# Patient Record
Sex: Female | Born: 1995 | Race: White | Hispanic: No | Marital: Single | Smoking: Current every day smoker
Health system: Southern US, Community
[De-identification: ages and names within clinical notes are randomized; demographics above are authoritative.]

## PROBLEM LIST (undated history)

## (undated) HISTORY — PX: MOUTH SURGERY: SHX715

---
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
## 2017-06-22 ENCOUNTER — Emergency Department
Admission: EM | Admit: 2017-06-22 | Discharge: 2017-06-22 | Disposition: A | Discharge status: Home / Self Care | Payer: Self-pay | Attending: Emergency Medicine | Admitting: Emergency Medicine

## 2017-06-22 ENCOUNTER — Encounter: Payer: Self-pay | Admitting: Emergency Medicine

## 2017-06-22 DIAGNOSIS — Z79899 Other long term (current) drug therapy: Secondary | ICD-10-CM | POA: Insufficient documentation

## 2017-06-22 DIAGNOSIS — F17200 Nicotine dependence, unspecified, uncomplicated: Secondary | ICD-10-CM | POA: Insufficient documentation

## 2017-06-22 DIAGNOSIS — N390 Urinary tract infection, site not specified: Secondary | ICD-10-CM | POA: Insufficient documentation

## 2017-06-22 DIAGNOSIS — R300 Dysuria: Secondary | ICD-10-CM | POA: Insufficient documentation

## 2017-06-22 LAB — URINALYSIS, COMPLETE (UACMP) WITH MICROSCOPIC
BILIRUBIN URINE: NEGATIVE
Bacteria, UA: NONE SEEN
GLUCOSE, UA: NEGATIVE mg/dL
KETONES UR: NEGATIVE mg/dL
Nitrite: NEGATIVE
Protein, ur: 100 mg/dL — AB
SPECIFIC GRAVITY, URINE: 1.014 (ref 1.005–1.030)
pH: 6 (ref 5.0–8.0)

## 2017-06-22 LAB — POCT PREGNANCY, URINE: Preg Test, Ur: NEGATIVE

## 2017-06-22 MED ORDER — CEPHALEXIN 500 MG PO CAPS: 500.0000 mg | ORAL_CAPSULE | Freq: Four times a day (QID) | ORAL | 0 refills | Status: AC

## 2017-06-22 MED ORDER — KETOROLAC TROMETHAMINE 60 MG/2ML IM SOLN
60.0000 mg | Freq: Once | INTRAMUSCULAR | Status: AC
  Administered 2017-06-22: 60 mg via INTRAMUSCULAR
  Filled 2017-06-22: qty 2

## 2017-06-22 MED ORDER — CEPHALEXIN 500 MG PO CAPS
500.0000 mg | ORAL_CAPSULE | Freq: Once | ORAL | Status: AC
  Administered 2017-06-22: 500 mg via ORAL
  Filled 2017-06-22: qty 1

## 2017-06-22 MED ORDER — TRAMADOL HCL 50 MG PO TABS: 50.0000 mg | ORAL_TABLET | Freq: Four times a day (QID) | ORAL | 0 refills | Status: DC | PRN

## 2017-06-22 NOTE — Discharge Instructions (Signed)
Please take the antibiotics but return if you have any fevers, nausea, vomiting or worsening symptoms. Please follow-up with the acute care clinic.

## 2017-06-22 NOTE — ED Triage Notes (Signed)
Pt ambulatory to triage with no difficulty. Pt reports started about 4 days ago with pain, burning and frequency when she urinated. Pt reports pain across her lower abd and across her kidney region. Pt talking in full and complete sentences with no difficulty at this time.

## 2017-06-22 NOTE — ED Provider Notes (Signed)
[facility] Emergency Department Provider Note   ____________________________________________   First MD Initiated Contact with Patient 06/22/17 [phone]     (approximate)  I have reviewed the triage vital signs and the nursing notes.   HISTORY  Chief Complaint Urinary Frequency    HPI Christine DaftKirsten Haynes is a 21 y.o. female who comes into the hospital today with some lower abdominal pain and right flank pain. She reports that started about 5 days ago. She has pain with urination and states that she noticed some blood in her urine. She also has a full smell to her urine. The patient has a history of UTIs in the past and feels as though it is the same. The patient has been taking Azo at home but it has not been helping. The patient rates her pain a 4 out of 10 in intensity. She's had no fevers but she's had some hot flashes. The patient denies nausea or vomiting. She decided to come into the hospital today for evaluation.   History reviewed. No pertinent past medical history.  There are no active problems to display for this patient.   Past Surgical History:  Procedure Laterality Date  . MOUTH SURGERY      Prior to Admission medications   Medication Sig Start Date End Date Taking? Authorizing Provider  cephALEXin (KEFLEX) 500 MG capsule Take 1 capsule (500 mg total) by mouth 4 (four) times daily. 06/22/17 07/02/17  Rebecka ApleyWebster, Masie Bermingham P, MD  meloxicam (MOBIC) 15 MG tablet Take 1 tablet (15 mg total) by mouth daily. 12/16/16   Cuthriell, Delorise RoyalsJonathan D, PA-C  methocarbamol (ROBAXIN) 500 MG tablet Take 1 tablet (500 mg total) by mouth 4 (four) times daily. 12/16/16   Cuthriell, Delorise RoyalsJonathan D, PA-C  traMADol (ULTRAM) 50 MG tablet Take 1 tablet (50 mg total) by mouth every 6 (six) hours as needed. 06/22/17   Rebecka ApleyWebster, Terena Bohan P, MD    Allergies Penicillins  No family history on file.  Social History Social History  Substance Use Topics  . Smoking status: Current Every Day  Smoker  . Smokeless tobacco: Not on file  . Alcohol use No    Review of Systems  Constitutional: No fever/chills Eyes: No visual changes. ENT: No sore throat. Cardiovascular: Denies chest pain. Respiratory: Denies shortness of breath. Gastrointestinal:  abdominal pain.  No nausea, no vomiting.  No diarrhea.  No constipation. Genitourinary:  dysuria. Musculoskeletal: back pain. Skin: Negative for rash. Neurological: Negative for headaches, focal weakness or numbness.   ____________________________________________   PHYSICAL EXAM:  VITAL SIGNS: ED Triage Vitals  Enc Vitals Group     BP 06/22/17 0102 112/68     Pulse Rate 06/22/17 0102 98     Resp 06/22/17 0102 18     Temp 06/22/17 0102 98.5 F (36.9 C)     Temp Source 06/22/17 0102 Oral     SpO2 06/22/17 0102 98 %     Weight 06/22/17 0103 125 lb (56.7 kg)     Height 06/22/17 0103 5\' 4"  (1.626 m)     Head Circumference --      Peak Flow --      Pain Score 06/22/17 0102 7     Pain Loc --      Pain Edu? --      Excl. in GC? --     Constitutional: Alert and oriented. Well appearing and in mild distress. Eyes: Conjunctivae are normal. PERRL. EOMI. Head: Atraumatic. Nose: No congestion/rhinnorhea. Mouth/Throat: Mucous membranes are moist.  Oropharynx non-erythematous. Cardiovascular: Normal rate, regular rhythm. Grossly normal heart sounds.  Good peripheral circulation. Respiratory: Normal respiratory effort.  No retractions. Lungs CTAB. Gastrointestinal: Soft and nontender. No distention. Positive bowel sounds Musculoskeletal: No lower extremity tenderness nor edema.   Neurologic:  Normal speech and language.  Skin:  Skin is warm, dry and intact.  Psychiatric: Mood and affect are normal.   ____________________________________________   LABS (all labs ordered are listed, but only abnormal results are displayed)  Labs Reviewed  URINALYSIS, COMPLETE (UACMP) WITH MICROSCOPIC - Abnormal; Notable for the following:         Result Value   Color, Urine YELLOW (*)    APPearance CLOUDY (*)    Hgb urine dipstick MODERATE (*)    Protein, ur 100 (*)    Leukocytes, UA LARGE (*)    Squamous Epithelial / LPF 6-30 (*)    All other components within normal limits  URINE CULTURE  POC URINE PREG, ED  POCT PREGNANCY, URINE   ____________________________________________  EKG  none ____________________________________________  RADIOLOGY  No results found.  ____________________________________________   PROCEDURES  Procedure(s) performed: None  Procedures  Critical Care performed: No  ____________________________________________   INITIAL IMPRESSION / ASSESSMENT AND PLAN / ED COURSE  Pertinent labs & imaging results that were available during my care of the patient were reviewed by me and considered in my medical decision making (see chart for details).  This is a 21 year old female who comes into the hospital today with some urinary frequency, lower abdominal pain, pain with urination and foul-smelling urine. The patient does have some dirty-appearing urine with too numerous to count red blood cells and white blood cells. The patient though was sleeping during the exam. She was in no acute distress. I will give the patient a dose of Keflex and a shot of Toradol. I will discharge the patient to home with some antibiotics to treat her UTI. I will also send a urine culture. The patient has no other concerns or no other distress and she'll be discharged home to follow-up with the acute care clinic for further evaluation. She should return with any other questions or concerns.      ____________________________________________   FINAL CLINICAL IMPRESSION(S) / ED DIAGNOSES  Final diagnoses:  Lower urinary tract infectious disease  Dysuria      NEW MEDICATIONS STARTED DURING THIS VISIT:  Discharge Medication List as of 06/22/2017  5:21 AM    START taking these medications   Details   cephALEXin (KEFLEX) 500 MG capsule Take 1 capsule (500 mg total) by mouth 4 (four) times daily., Starting Mon 06/22/2017, Until Thu 07/02/2017, Print    traMADol (ULTRAM) 50 MG tablet Take 1 tablet (50 mg total) by mouth every 6 (six) hours as needed., Starting Mon 06/22/2017, Print         Note:  This document was prepared using Dragon voice recognition software and may include unintentional dictation errors.    Rebecka Apley, MD 06/22/17 [phone]

## 2017-06-24 LAB — URINE CULTURE
Culture: >=100000 — AB
Special Requests: NORMAL

## 2017-06-25 NOTE — Progress Notes (Signed)
ED Culture Results   Allergies: Penicillins  Visit Date:06/22/17 Chief Complaint:dysuria/  Culture Type: urine Culture Results: 100k CFU Staph saprophyticus  Original Abx given: Keflex 500 mg  Original Abx sensitive, intermediate, or resistant: resistant to oxacillin  Recommended Abx: Nitrofurantoin  ED Physician: Deirdre PriestPadchowski, Kevin  Contacted Patient: Yes left message with patient's father for patient to call back @ earliest convenience  Prescription Called into: Awaiting call back .  Demetrius Charityeldrin D. Shaela Boer, PharmD, BCPS Clinical Pharmacist

## 2017-06-26 NOTE — Progress Notes (Signed)
ED Culture Results   Allergies: Penicillins   Visit Date:06/22/17  Chief Complaint:dysuria            Dx: UTI  Culture Type: urine Culture Results: 100k CFU Staph saprophyticus   Original Abx given: Keflex 500 mg  Original Abx sensitive, intermediate, or resistant: resistant to oxacillin   Recommended Abx: Nitrofurantoin (Macrobid) 100mg  BID x 5 days. Discontinue current Abx.  ED Physician: Deirdre PriestPadchowski, Kevin   Contacted Patient: Day 2 of attempting to reach patient. Called phone number listed in chart [phone]) for patient and left voice mail at 1400 on 7/13 for patient to call back. Attempted to call number 3 times, but number goes directly to voice mail.   Message was also left with father on 7/12 to have patient call pharmacy.   Prescription Called into: Awaiting call back .  Gardner CandleSheema M Felesha Moncrieffe, PharmD, BCPS Clinical Pharmacist 06/26/2017 3:25 PM

## 2020-03-22 ENCOUNTER — Telehealth: Payer: Self-pay | Admitting: Obstetrics & Gynecology

## 2020-03-22 NOTE — Telephone Encounter (Signed)
[place] [facility] referring for pain with intercourse. Attempt to reach patient to schedule referral. Phone number on file rang a couple of times and disconnected on the other end.

## 2020-03-23 NOTE — Telephone Encounter (Signed)
Called and left voice mail for patient to call back to be schedule °

## 2020-03-26 NOTE — Telephone Encounter (Signed)
Called and left voice mail for patient to call back to be schedule °

## 2020-04-16 ENCOUNTER — Other Ambulatory Visit: Payer: Self-pay

## 2020-04-16 ENCOUNTER — Encounter: Payer: Self-pay | Admitting: Obstetrics and Gynecology

## 2020-04-16 ENCOUNTER — Other Ambulatory Visit (HOSPITAL_COMMUNITY)
Admission: RE | Admit: 2020-04-16 | Discharge: 2020-04-16 | Disposition: A | Discharge status: Home / Self Care | Payer: 59 | Source: Ambulatory Visit | Attending: Obstetrics and Gynecology | Admitting: Obstetrics and Gynecology

## 2020-04-16 ENCOUNTER — Ambulatory Visit (INDEPENDENT_AMBULATORY_CARE_PROVIDER_SITE_OTHER): Payer: 59 | Admitting: Obstetrics and Gynecology

## 2020-04-16 VITALS — BP 122/78 | Ht 65.0 in | Wt 139.0 lb

## 2020-04-16 DIAGNOSIS — N941 Unspecified dyspareunia: Secondary | ICD-10-CM | POA: Diagnosis not present

## 2020-04-16 DIAGNOSIS — Z124 Encounter for screening for malignant neoplasm of cervix: Secondary | ICD-10-CM | POA: Diagnosis not present

## 2020-04-16 DIAGNOSIS — R102 Pelvic and perineal pain: Secondary | ICD-10-CM

## 2020-04-16 DIAGNOSIS — N730 Acute parametritis and pelvic cellulitis: Secondary | ICD-10-CM | POA: Diagnosis not present

## 2020-04-16 DIAGNOSIS — Z113 Encounter for screening for infections with a predominantly sexual mode of transmission: Secondary | ICD-10-CM | POA: Insufficient documentation

## 2020-04-16 MED ORDER — METRONIDAZOLE 500 MG PO TABS: 500.0000 mg | ORAL_TABLET | Freq: Two times a day (BID) | ORAL | 0 refills | Status: AC

## 2020-04-16 MED ORDER — DOXYCYCLINE HYCLATE 100 MG PO CAPS: 100.0000 mg | ORAL_CAPSULE | Freq: Two times a day (BID) | ORAL | 0 refills | Status: AC

## 2020-04-16 MED ORDER — CEFTRIAXONE SODIUM 250 MG IJ SOLR
250.0000 mg | Freq: Once | INTRAMUSCULAR | Status: AC
  Administered 2020-04-16: 250 mg via INTRAMUSCULAR

## 2020-04-16 NOTE — Patient Instructions (Addendum)
Semen Analysis Test Why am I having this test? A semen analysis test is done to check certain aspects of the health of a man's reproductive organs (testes) and the hormone system that plays a role in semen production. Semen is a whitish secretion that is released from the penis during the final phase of orgasm (ejaculation). It is made up of liquids and nutrients from the prostate gland, seminal vesicles, and other glands. It also contains sperm cells from the testes. A single sperm cell contains one complete set of a man's genetic coding (chromosomes). You may have this test as a part of infertility testing, which is done to help find out reasons for the inability to have a child. The test may also be done to determine whether a previously performed vasectomy was successful. A vasectomy is a procedure done to make a man permanently infertile by tying the tube (the vas deferens) that collects the sperm from the testicle. A vasectomy blocks the sperm from going through the vas deferens and penis so that the sperm will not go into the vagina during sexual intercourse. What is being tested? If the test is done as part of infertility testing, the shape (morphology), size, and movement (motility) of sperm cells will be included in the analysis. This testing may also include assessing a sperm cell's ability to penetrate an egg (fertilize) as well as the formation of genetic material (DNA). If the test is done to check the success of a vasectomy, the sample will be checked for the presence of sperm. What kind of sample is taken? A semen sample is required for this test. A semen sample will be collected by ejaculation into a sterile glass or plastic container provided by the lab. This can be done at home, in your health care provider's office, or in the lab. How do I collect samples at home?  If the sample will be collected at home, follow your health care provider's instructions about how to collect the  sample. The sample should be delivered to the lab within 1 hour after collection. It should also be protected from extreme heat or cold. How do I prepare for this test? For infertility testing: Avoid sexual activity for 2-3 days before the semen sample collection. However, do not avoid ejaculation for a prolonged period because this can alter the motility of sperm cells. For vasectomy success testing: Make sure that you ejaculate one or two times before the day of semen sample collection. This will clear the vas deferens of any sperm that were present before the vasectomy was performed. Tell a health care provider about:  All medicines you are taking, including vitamins, herbs, eye drops, creams, and over-the-counter medicines. How are the results reported? Your test results will be reported as values. Your health care provider will compare your results to normal ranges that were established after testing a large group of people (reference values). Reference values may vary among labs and hospitals. For this test, common reference values are:  Volume: 2-5 mL.  Liquefaction time: 20-30 minutes after collection.  Appearance: normal (whitish in color).  Motile/mL: greater than or equal to 10 million.  Sperm/mL: greater than or equal to 20 million.  Viscosity: greater than or equal to 3.  Agglutination: greater than or equal to 3.  Supravital: greater than or equal to 75% live.  Fructose: positive.  pH: 7.12-8.  Sperm count (density): greater than or equal to 20 million/mL.  Sperm motility: greater than or equal  to 50% at 1 hour.  Sperm morphology: greater than 30% Reuben Likes criteria greater than 14%) normally shaped. What do the results mean? Low sperm count, abnormal motility, or abnormal morphology of sperm cells may all cause problems with female fertility. Abnormal results can also be caused by:  Certain infectious diseases, such as inflammation of a testis (orchitis) from  mumps.  Receiving certain kinds of toxic drugs, such as chemotherapy.  Having testicles that did not develop normally in childhood. When the semen analysis test is done to check the success of a vasectomy, the presence of sperm may mean that the surgery was not successful. Your health care provider may suggest a repeat of this test. Talk with your health care provider about what your results mean. Questions to ask your health care provider Ask your health care provider, or the department that is doing the test:  When will my results be ready?  How will I get my results?  What are my treatment options?  What other tests do I need?  What are my next steps? Summary  A semen analysis test is done to check certain aspects of the health of a man's reproductive organs (testes) and the hormone system that plays a role in semen production.  You may have this test as a part of infertility testing, which is done to help find out reasons for the inability to have a child. The test may also be done to determine whether a previously performed vasectomy was successful.  A semen sample will be collected by ejaculation into a sterile glass or plastic container. Follow instructions about how to collect the sample, and make sure you deliver the sample to the lab within 1 hour of obtaining it.  Talk with your health care provider about what your results mean. This information is not intended to replace advice given to you by your health care provider. Make sure you discuss any questions you have with your health care provider. Document Revised: 08/27/2017 Document Reviewed: 08/27/2017 Elsevier Patient Education  2020 [employer].  Female Infertility  Female infertility refers to a woman's inability to get pregnant (conceive) after a year of having sex regularly (or after 6 months in women over age 74) without using birth control. Infertility can also mean that a woman is not able to carry a pregnancy  to full term. Both women and men can have fertility problems. What are the causes? This condition may be caused by:  Problems with reproductive organs. Infertility can result if a woman: ? Has an abnormally short cervix or a cervix that does not remain closed during a pregnancy. ? Has a blockage or scarring in the fallopian tubes. ? Has an abnormally shaped uterus. ? Has uterine fibroids. This is a benign mass of tissue or muscle (tumor) that can develop in the uterus. ? Is not ovulating in a regular way.  Certain medical conditions. These may include: ? Polycystic ovary syndrome (PCOS). This is a hormonal disorder that can cause small cysts to grow on the ovaries. This is the most common cause of infertility in women. ? Endometriosis. This is a condition in which the tissue that lines the uterus (endometrium) grows outside of its normal location. ? Cancer and cancer treatments, such as chemotherapy or radiation. ? Premature ovarian failure. This is when ovaries stop producing eggs and hormones before age 51. ? Sexually transmitted diseases, such as chlamydia or gonorrhea. ? Autoimmune disorders. These are disorders in which the body's defense system (immune  system) attacks normal, healthy cells. Infertility can be linked to more than one cause. For some women, the cause of infertility is not known (unexplained infertility). What increases the risk?  Age. A woman's fertility declines with age, especially after her mid-30s.  Being underweight or overweight.  Drinking too much alcohol.  Using drugs such as anabolic steroids, cocaine, and marijuana.  Exercising excessively.  Being exposed to environmental toxins, such as radiation, pesticides, and certain chemicals. What are the signs or symptoms? The main sign of infertility in women is the inability to get pregnant or carry a pregnancy to full term. How is this diagnosed? This condition may be diagnosed by:  Checking whether you  are ovulating each month. The tests may include: ? Blood tests to check hormone levels. ? An ultrasound of the ovaries. ? Taking a small tissue that lines the uterus and checking it under a microscope (endometrial biopsy).  Doing additional tests. This is done if ovulation is normal. Tests may include: ? Hysterosalpingography. This X-ray test can show the shape of the uterus and whether the fallopian tubes are open. ? Laparoscopy. This test uses a lighted tube (laparoscope) to look for problems in the fallopian tubes and other organs. ? Transvaginal ultrasound. This imaging test is used to check for abnormalities in the uterus and ovaries. ? Hysteroscopy. This test uses a lighted tube to check for problems in the cervix and the uterus. To be diagnosed with infertility, both partners will have a physical exam. Both partners will also have an extensive medical and sexual history taken. Additional tests may be done. How is this treated? Treatment depends on the cause of infertility. Most cases of infertility in women are treated with medicine or surgery.  Women may take medicine to: ? Correct ovulation problems. ? Treat other health conditions.  Surgery may be done to: ? Repair damage to the ovaries, fallopian tubes, cervix, or uterus. ? Remove growths from the uterus. ? Remove scar tissue from the uterus, pelvis, or other organs. Assisted reproductive technology (ART) Assisted reproductive technology (ART) refers to all treatments and procedures that combine eggs and sperm outside the body to try to help a couple conceive. ART is often combined with fertility drugs to stimulate ovulation. Sometimes ART is done using eggs retrieved from another woman's body (donor eggs) or from previously frozen fertilized eggs (embryos). There are different types of ART. These include:  Intrauterine insemination (IUI). A long, thin tube is used to place sperm directly into a woman's uterus. This  procedure: ? Is effective for infertility caused by sperm problems, including low sperm count and low motility. ? Can be used in combination with fertility drugs.  In vitro fertilization (IVF). This is done when a woman's fallopian tubes are blocked or when a man has low sperm count. In this procedure: ? Fertility drugs are used to stimulate the ovaries to produce multiple eggs. ? Once mature, these eggs are removed from the body and combined with the sperm to be fertilized. ? The fertilized eggs are then placed into the woman's uterus. Follow these instructions at home:  Take over-the-counter and prescription medicines only as told by your health care provider.  Do not use any products that contain nicotine or tobacco, such as cigarettes and e-cigarettes. If you need help quitting, ask your health care provider.  If you drink alcohol, limit how much you have to 1 drink a day.  Make dietary changes to lose weight or maintain a healthy weight. Work  with your health care provider and a dietitian to set a weight-loss goal that is healthy and reasonable for you.  Seek support from a counselor or support group to talk about your concerns related to infertility. Couples counseling may be helpful for you and your partner.  Practice stress reduction techniques that work well for you, such as regular physical activity, meditation, or deep breathing.  Keep all follow-up visits as told by your health care provider. This is important. Contact a health care provider if you:  Feel that stress is interfering with your life and relationships.  Have side effects from treatments for infertility. Summary  Female infertility refers to a woman's inability to get pregnant (conceive) after a year of having sex regularly (or after 6 months in women over age 42) without using birth control.  To be diagnosed with infertility, both partners will have a physical exam. Both partners will also have an extensive  medical and sexual history taken.  Seek support from a counselor or support group to talk about your concerns related to infertility. Couples counseling may be helpful for you and your partner. This information is not intended to replace advice given to you by your health care provider. Make sure you discuss any questions you have with your health care provider. Document Revised: 03/24/2019 Document Reviewed: 11/02/2017 Elsevier Patient Education  [place]. Dyspareunia, Female Dyspareunia is pain that is associated with sexual activity. This can affect any part of the genitals or lower abdomen. There are many possible causes of this condition. In some cases, diagnosing the cause of dyspareunia can be difficult. This condition can be mild, moderate, or severe. Depending on the cause, dyspareunia may get better with treatment, but may return (recur) over time. What are the causes?  The cause of this condition is not always known. However, problems that affect the vulva, vagina, uterus, and other organs may cause dyspareunia. Common causes of this condition include:  Vaginal dryness.  Giving birth.  Infection.  Skin changes or conditions.  Side effects of medicines.  Endometriosis. This is when tissue that is like the lining of the uterus grows on the outside of the uterus.  Psychological conditions. These include depression, anxiety, or traumatic experiences.  Allergic reaction. What increases the risk? The following factors may make you more likely to develop this condition:  History of physical or sexual trauma.  Some medicines.  No longer having a monthly period (menopause).  Having recently given birth.  Taking baths using soaps that have perfumes. These can cause irritation.  Douching. What are the signs or symptoms? The main symptom of this condition is pain in any part of your genitals or lower abdomen during or after sex. This may include:  Irritation,  burning, or stinging sensations in your vulva.  Discomfort when your vulva or surrounding area is touched.  Aching and throbbing pain that may be constant.  Pain that gets worse when something is inserted into your vagina. How is this diagnosed? This condition may be diagnosed based on:  Your symptoms, including where and when your pain occurs.  Your medical history.  A physical exam. A pelvic exam will most likely be done.  Tests that include ultrasound, blood tests, and tests that check the body for infection.  Imaging tests, such as X-ray, MRI, and CT scan. You may be referred to a health care provider who specializes in women's health (gynecologist). How is this treated? Treatment depends on the cause of your condition and  your symptoms. In most cases, you may need to stop sexual activity until your symptoms go away or get better. Treatment may include:  Lubricants, ointments, and creams.  Physical therapy.  Massage therapy.  Hormonal therapy.  Medicines to: ? Prevent or fight infection. ? Relieve pain. ? Help numb the area. ? Treat depression (antidepressants).  Counseling, which may include sex therapy.  Surgery. Follow these instructions at home: Lifestyle  Wear cotton underwear.  Use water-based lubricants as needed during sex. Avoid oil-based lubricants.  Do not use any products that can cause irritation. This may include certain condoms, spermicides, lubricants, soaps, tampons, vaginal sprays, or douches.  Always practice safe sex. Use a condom to prevent sexually transmitted infections (STIs).  Talk freely with your partner about your condition. General instructions  Take or apply over-the-counter and prescription medicines only as told by your health care provider.  Urinate before you have sex.  Consider joining a support group.  Get the results of any tests you have done. Ask your health care provider, or the department that is doing the  procedure, when your results will be ready.  Keep all follow-up visits as told by your health care provider. This is important. Contact a health care provider if:  You have vaginal bleeding after having sex.  You develop a lump at the opening of your vagina even if the lump is painless.  You have: ? Abnormal discharge from your vagina. ? Vaginal dryness. ? Itchiness or irritation of your vulva or vagina. ? A new rash. ? Symptoms that get worse or do not improve with treatment. ? A fever. ? Pain when you urinate. ? Blood in your urine. Get help right away if:  You have severe pain in your abdomen during or shortly after sex.  You pass out after sex. Summary  Dyspareunia is pain that is associated with sexual activity. This can affect any part of the genitals or lower abdomen.  There are many causes of this condition. Treatment depends on the cause and your symptoms. In most cases, you may need to stop sexual activity until your symptoms improve.  Take or apply over-the-counter and prescription medicines only as told by your health care provider.  Contact a health care provider if your symptoms get worse or do not improve with treatment.  Keep all follow-up visits as told by your health care provider. This is important. This information is not intended to replace advice given to you by your health care provider. Make sure you discuss any questions you have with your health care provider. Document Revised: 02/07/2019 Document Reviewed: 02/07/2019 Elsevier Patient Education  2020 [employer].
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
## 2020-04-18 LAB — NUSWAB BV AND CANDIDA, NAA
Atopobium vaginae: HIGH Score — AB
Candida albicans, NAA: NEGATIVE
Candida glabrata, NAA: NEGATIVE

## 2020-04-19 LAB — CYTOLOGY - PAP
Chlamydia: NEGATIVE
Comment: NEGATIVE
Comment: NORMAL
Diagnosis: NEGATIVE
Neisseria Gonorrhea: NEGATIVE

## 2020-04-26 NOTE — Progress Notes (Signed)
Please let patient know her pap smear was normal and she did not have evidence of infection. Good news!

## 2020-04-27 ENCOUNTER — Telehealth: Payer: Self-pay

## 2020-04-27 NOTE — Telephone Encounter (Signed)
-----   Message from Natale Milch, MD sent at 04/26/2020  5:07 PM EDT ----- Please let patient know her pap smear was normal and she did not have evidence of infection. Good news!

## 2020-04-27 NOTE — Telephone Encounter (Signed)
LMVM to notify pt. Ok to leave detailed message per [facility]

## 2020-04-30 ENCOUNTER — Other Ambulatory Visit: Payer: Self-pay | Admitting: Obstetrics and Gynecology

## 2020-04-30 ENCOUNTER — Telehealth: Payer: Self-pay

## 2020-04-30 DIAGNOSIS — B3731 Acute candidiasis of vulva and vagina: Secondary | ICD-10-CM

## 2020-04-30 MED ORDER — FLUCONAZOLE 150 MG PO TABS: 150.0000 mg | ORAL_TABLET | ORAL | 0 refills | Status: AC

## 2020-04-30 NOTE — Telephone Encounter (Signed)
Spoke w/patient. She reports her stomach cramps are worse. Advised doxycycline can cause stomach upset. Prior to meds she had pain with intercourse (hurt inside vagina). Now she is having pain at the vaginal opening and is unable to have intercourse. Patient inquired about her apt time for next Tuesday 5/24. Relayed U/S @ 2:30 and f/u with [facility] immediately following (3:10).

## 2020-04-30 NOTE — Telephone Encounter (Signed)
Patient took medication for 4-5 days then stopped last Thursday. She reports that she is having more discharge and now pain with initiation of sex. Denies appearance of external lesions. Saw a small pink blood after wiping. Will treat with diflucan and patient will follow up next week.

## 2020-04-30 NOTE — Telephone Encounter (Signed)
LMVM TRC. Advised to ask for Lupe Handley. Need details of worsening of symptoms.

## 2020-04-30 NOTE — Telephone Encounter (Signed)
Patient had an apt last week & was rx'd 2 meds that are making her symptoms worse. She's inquiring if she can stop these meds and be rx'd something else. MC#[phone]

## 2020-05-07 ENCOUNTER — Other Ambulatory Visit: Payer: Self-pay

## 2020-05-07 ENCOUNTER — Ambulatory Visit (INDEPENDENT_AMBULATORY_CARE_PROVIDER_SITE_OTHER): Payer: 59

## 2020-05-07 ENCOUNTER — Encounter: Payer: Self-pay | Admitting: Obstetrics and Gynecology

## 2020-05-07 ENCOUNTER — Ambulatory Visit (INDEPENDENT_AMBULATORY_CARE_PROVIDER_SITE_OTHER): Payer: 59 | Admitting: Obstetrics and Gynecology

## 2020-05-07 VITALS — BP 120/68 | Ht 65.0 in | Wt 138.0 lb

## 2020-05-07 DIAGNOSIS — R102 Pelvic and perineal pain: Secondary | ICD-10-CM | POA: Diagnosis not present

## 2020-05-07 DIAGNOSIS — N941 Unspecified dyspareunia: Secondary | ICD-10-CM

## 2020-05-07 DIAGNOSIS — N730 Acute parametritis and pelvic cellulitis: Secondary | ICD-10-CM

## 2020-05-07 DIAGNOSIS — N979 Female infertility, unspecified: Secondary | ICD-10-CM

## 2020-05-07 MED ORDER — LIDOCAINE 5 % EX OINT: 1.0000 "application " | TOPICAL_OINTMENT | Freq: Every day | CUTANEOUS | 1 refills | Status: DC | PRN

## 2020-05-07 NOTE — Patient Instructions (Signed)
Steps to Quit Smoking Smoking tobacco is the leading cause of preventable death. It can affect almost every organ in the body. Smoking puts you and those around you at risk for developing many serious chronic diseases. Quitting smoking can be difficult, but it is one of the best things that you can do for your health. It is never too late to quit. How do I get ready to quit? When you decide to quit smoking, create a plan to help you succeed. Before you quit:  Pick a date to quit. Set a date within the next 2 weeks to give you time to prepare.  Write down the reasons why you are quitting. Keep this list in places where you will see it often.  Tell your family, friends, and co-workers that you are quitting. Support from your loved ones can make quitting easier.  Talk with your health care provider about your options for quitting smoking.  Find out what treatment options are covered by your health insurance.  Identify people, places, things, and activities that make you want to smoke (triggers). Avoid them. What first steps can I take to quit smoking?  Throw away all cigarettes at home, at work, and in your car.  Throw away smoking accessories, such as ashtrays and lighters.  Clean your car. Make sure to empty the ashtray.  Clean your home, including curtains and carpets. What strategies can I use to quit smoking? Talk with your health care provider about combining strategies, such as taking medicines while you are also receiving in-person counseling. Using these two strategies together makes you more likely to succeed in quitting than if you used either strategy on its own.  If you are pregnant or breastfeeding, talk with your health care provider about finding counseling or other support strategies to quit smoking. Do not take medicine to help you quit smoking unless your health care provider tells you to do so. To quit smoking: Quit right away  Quit smoking completely, instead of  gradually reducing how much you smoke over a period of time. Research shows that stopping smoking right away is more successful than gradually quitting.  Attend in-person counseling to help you build problem-solving skills. You are more likely to succeed in quitting if you attend counseling sessions regularly. Even short sessions of 10 minutes can be effective. Take medicine You may take medicines to help you quit smoking. Some medicines require a prescription and some you can purchase over-the-counter. Medicines may have nicotine in them to replace the nicotine in cigarettes. Medicines may:  Help to stop cravings.  Help to relieve withdrawal symptoms. Your health care provider may recommend:  Nicotine patches, gum, or lozenges.  Nicotine inhalers or sprays.  Non-nicotine medicine that is taken by mouth. Find resources Find resources and support systems that can help you to quit smoking and remain smoke-free after you quit. These resources are most helpful when you use them often. They include:  Online chats with a counselor.  Telephone quitlines.  Printed self-help materials.  Support groups or group counseling.  Text messaging programs.  Mobile phone apps or applications. Use apps that can help you stick to your quit plan by providing reminders, tips, and encouragement. There are many free apps for mobile devices as well as websites. Examples include Quit Guide from the CDC and smokefree.gov What things can I do to make it easier to quit?   Reach out to your family and friends for support and encouragement. Call telephone quitlines (1-800-QUIT-NOW), reach   out to support groups, or work with a Veterinary surgeoncounselor for support.  Ask people who smoke to avoid smoking around you.  Avoid places that trigger you to smoke, such as bars, parties, or smoke-break areas at work.  Spend time with people who do not smoke.  Lessen the stress in your life. Stress can be a smoking trigger for some  people. To lessen stress, try: ? Exercising regularly. ? Doing deep-breathing exercises. ? Doing yoga. ? Meditating. ? Performing a body scan. This involves closing your eyes, scanning your body from head to toe, and noticing which parts of your body are particularly tense. Try to relax the muscles in those areas. How will I feel when I quit smoking? Day 1 to 3 weeks Within the first 24 hours of quitting smoking, you may start to feel withdrawal symptoms. These symptoms are usually most noticeable 2-3 days after quitting, but they usually do not last for more than 2-3 weeks. You may experience these symptoms:  Mood swings.  Restlessness, anxiety, or irritability.  Trouble concentrating.  Dizziness.  Strong cravings for sugary foods and nicotine.  Mild weight gain.  Constipation.  Nausea.  Coughing or a sore throat.  Changes in how the medicines that you take for unrelated issues work in your body.  Depression.  Trouble sleeping (insomnia). Week 3 and afterward After the first 2-3 weeks of quitting, you may start to notice more positive results, such as:  Improved sense of smell and taste.  Decreased coughing and sore throat.  Slower heart rate.  Lower blood pressure.  Clearer skin.  The ability to breathe more easily.  Fewer sick days. Quitting smoking can be very challenging. Do not get discouraged if you are not successful the first time. Some people need to make many attempts to quit before they achieve long-term success. Do your best to stick to your quit plan, and talk with your health care provider if you have any questions or concerns. Summary  Smoking tobacco is the leading cause of preventable death. Quitting smoking is one of the best things that you can do for your health.  When you decide to quit smoking, create a plan to help you succeed.  Quit smoking right away, not slowly over a period of time.  When you start quitting, seek help from your  health care provider, family, or friends. This information is not intended to replace advice given to you by your health care provider. Make sure you discuss any questions you have with your health care provider. Document Revised: 08/26/2019 Document Reviewed: 02/19/2019 Elsevier Patient Education  2020 [employer]   Bupropion sustained-release tablets (smoking cessation) What is this medicine? BUPROPION (byoo PROE pee on) is used to help people quit smoking. This medicine may be used for other purposes; ask your health care provider or pharmacist if you have questions. COMMON BRAND NAME(S): Buproban, Zyban What should I tell my health care provider before I take this medicine? They need to know if you have any of these conditions:  an eating disorder, such as anorexia or bulimia  bipolar disorder or psychosis  diabetes or high blood sugar, treated with medication  glaucoma  head injury or brain tumor  heart disease, previous heart attack, or irregular heart beat  high blood pressure  kidney or liver disease  seizures  suicidal thoughts or a previous suicide attempt  Tourette's syndrome  weight loss  an unusual or allergic reaction to bupropion, other medicines, foods, dyes, or preservatives  breast-feeding  pregnant or trying to become pregnant How should I use this medicine? Take this medicine by mouth with a glass of water. Follow the directions on the prescription label. You can take it with or without food. If it upsets your stomach, take it with food. Do not cut, crush or chew this medicine. Take your medicine at regular intervals. If you take this medicine more than once a day, take your second dose at least 8 hours after you take your first dose. To limit difficulty in sleeping, avoid taking this medicine at bedtime. Do not take your medicine more often than directed. Do not stop taking this medicine suddenly except upon the advice of your doctor. Stopping this  medicine too quickly may cause serious side effects. A special MedGuide will be given to you by the pharmacist with each prescription and refill. Be sure to read this information carefully each time. Talk to your pediatrician regarding the use of this medicine in children. Special care may be needed. Overdosage: If you think you have taken too much of this medicine contact a poison control center or emergency room at once. NOTE: This medicine is only for you. Do not share this medicine with others. What if I miss a dose? If you miss a dose, skip the missed dose and take your next tablet at the regular time. There should be at least 8 hours between doses. Do not take double or extra doses. What may interact with this medicine? Do not take this medicine with any of the following medications:  linezolid  MAOIs like Azilect, Carbex, Eldepryl, Marplan, Nardil, and Parnate  methylene blue (injected into a vein)  other medicines that contain bupropion like Wellbutrin This medicine may also interact with the following medications:  alcohol  certain medicines for anxiety or sleep  certain medicines for blood pressure like metoprolol, propranolol  certain medicines for depression or psychotic disturbances  certain medicines for HIV or AIDS like efavirenz, lopinavir, nelfinavir, ritonavir  certain medicines for irregular heart beat like propafenone, flecainide  certain medicines for Parkinson's disease like amantadine, levodopa  certain medicines for seizures like carbamazepine, phenytoin, phenobarbital  cimetidine  clopidogrel  cyclophosphamide  digoxin  furazolidone  isoniazid  nicotine  orphenadrine  procarbazine  steroid medicines like prednisone or cortisone  stimulant medicines for attention disorders, weight loss, or to stay awake  tamoxifen  theophylline  thiotepa  ticlopidine  tramadol  warfarin This list may not describe all possible interactions.  Give your health care provider a list of all the medicines, herbs, non-prescription drugs, or dietary supplements you use. Also tell them if you smoke, drink alcohol, or use illegal drugs. Some items may interact with your medicine. What should I watch for while using this medicine? Visit your doctor or healthcare provider for regular checks on your progress. This medicine should be used together with a patient support program. It is important to participate in a behavioral program, counseling, or other support program that is recommended by your healthcare provider. This medicine may cause serious skin reactions. They can happen weeks to months after starting the medicine. Contact your healthcare provider right away if you notice fevers or flu-like symptoms with a rash. The rash may be red or purple and then turn into blisters or peeling of the skin. Or, you might notice a red rash with swelling of the face, lips or lymph nodes in your neck or under your arms. Patients and their families should watch out for new or worsening thoughts  of suicide or depression. Also watch out for sudden changes in feelings such as feeling anxious, agitated, panicky, irritable, hostile, aggressive, impulsive, severely restless, overly excited and hyperactive, or not being able to sleep. If this happens, especially at the beginning of treatment or after a change in dose, call your healthcare provider. Avoid alcoholic drinks while taking this medicine. Drinking excessive alcoholic beverages, using sleeping or anxiety medicines, or quickly stopping the use of these agents while taking this medicine may increase your risk for a seizure. Do not drive or use heavy machinery until you know how this medicine affects you. This medicine can impair your ability to perform these tasks. Do not take this medicine close to bedtime. It may prevent you from sleeping. Your mouth may get dry. Chewing sugarless gum or sucking hard candy, and  drinking plenty of water may help. Contact your doctor if the problem does not go away or is severe. Do not use nicotine patches or chewing gum without the advice of your doctor or healthcare provider while taking this medicine. You may need to have your blood pressure taken regularly if your doctor recommends that you use both nicotine and this medicine together. What side effects may I notice from receiving this medicine? Side effects that you should report to your doctor or health care professional as soon as possible:  allergic reactions like skin rash, itching or hives, swelling of the face, lips, or tongue  breathing problems  changes in vision  confusion  elevated mood, decreased need for sleep, racing thoughts, impulsive behavior  fast or irregular heartbeat  hallucinations, loss of contact with reality  increased blood pressure  rash, fever, and swollen lymph nodes  redness, blistering, peeling, or loosening of the skin, including inside the mouth  seizures  suicidal thoughts or other mood changes  unusually weak or tired  vomiting Side effects that usually do not require medical attention (report to your doctor or health care professional if they continue or are bothersome):  constipation  headache  loss of appetite  nausea  tremors  weight loss This list may not describe all possible side effects. Call your doctor for medical advice about side effects. You may report side effects to FDA at 1-800-FDA-1088. Where should I keep my medicine? Keep out of the reach of children. Store at room temperature between 20 and 25 degrees C (68 and 77 degrees F). Protect from light. Keep container tightly closed. Throw away any unused medicine after the expiration date. NOTE: This sheet is a summary. It may not cover all possible information. If you have questions about this medicine, talk to your doctor, pharmacist, or health care provider.  2020 Elsevier/Gold Standard  (2019-02-24 13:59:09)    Coping with Quitting Smoking  Quitting smoking is a physical and mental challenge. You will face cravings, withdrawal symptoms, and temptation. Before quitting, work with your health care provider to make a plan that can help you cope. Preparation can help you quit and keep you from giving in. How can I cope with cravings? Cravings usually last for 5-10 minutes. If you get through it, the craving will pass. Consider taking the following actions to help you cope with cravings:  Keep your mouth busy: ? Chew sugar-free gum. ? Suck on hard candies or a straw. ? Brush your teeth.  Keep your hands and body busy: ? Immediately change to a different activity when you feel a craving. ? Squeeze or play with a ball. ? Do an activity or  a hobby, like making bead jewelry, practicing needlepoint, or working with wood. ? Mix up your normal routine. ? Take a short exercise break. Go for a quick walk or run up and down stairs. ? Spend time in public places where smoking is not allowed.  Focus on doing something kind or helpful for someone else.  Call a friend or family member to talk during a craving.  Join a support group.  Call a quit line, such as 1-800-QUIT-NOW.  Talk with your health care provider about medicines that might help you cope with cravings and make quitting easier for you. How can I deal with withdrawal symptoms? Your body may experience negative effects as it tries to get used to not having nicotine in the system. These effects are called withdrawal symptoms. They may include:  Feeling hungrier than normal.  Trouble concentrating.  Irritability.  Trouble sleeping.  Feeling depressed.  Restlessness and agitation.  Craving a cigarette. To manage withdrawal symptoms:  Avoid places, people, and activities that trigger your cravings.  Remember why you want to quit.  Get plenty of sleep.  Avoid coffee and other caffeinated drinks. These may  worsen some of your symptoms. How can I handle social situations? Social situations can be difficult when you are quitting smoking, especially in the first few weeks. To manage this, you can:  Avoid parties, bars, and other social situations where people might be smoking.  Avoid alcohol.  Leave right away if you have the urge to smoke.  Explain to your family and friends that you are quitting smoking. Ask for understanding and support.  Plan activities with friends or family where smoking is not an option. What are some ways I can cope with stress? Wanting to smoke may cause stress, and stress can make you want to smoke. Find ways to manage your stress. Relaxation techniques can help. For example:  Breathe slowly and deeply, in through your nose and out through your mouth.  Listen to soothing, relaxing music.  Talk with a family member or friend about your stress.  Light a candle.  Soak in a bath or take a shower.  Think about a peaceful place. What are some ways I can prevent weight gain? Be aware that many people gain weight after they quit smoking. However, not everyone does. To keep from gaining weight, have a plan in place before you quit and stick to the plan after you quit. Your plan should include:  Having healthy snacks. When you have a craving, it may help to: ? Eat plain popcorn, crunchy carrots, celery, or other cut vegetables. ? Chew sugar-free gum.  Changing how you eat: ? Eat small portion sizes at meals. ? Eat 4-6 small meals throughout the day instead of 1-2 large meals a day. ? Be mindful when you eat. Do not watch television or do other things that might distract you as you eat.  Exercising regularly: ? Make time to exercise each day. If you do not have time for a long workout, do short bouts of exercise for 5-10 minutes several times a day. ? Do some form of strengthening exercise, like weight lifting, and some form of aerobic exercise, like running or  swimming.  Drinking plenty of water or other low-calorie or no-calorie drinks. Drink 6-8 glasses of water daily, or as much as instructed by your health care provider. Summary  Quitting smoking is a physical and mental challenge. You will face cravings, withdrawal symptoms, and temptation to smoke again. Preparation  can help you as you go through these challenges.  You can cope with cravings by keeping your mouth busy (such as by chewing gum), keeping your body and hands busy, and making calls to family, friends, or a helpline for people who want to quit smoking.  You can cope with withdrawal symptoms by avoiding places where people smoke, avoiding drinks with caffeine, and getting plenty of rest.  Ask your health care provider about the different ways to prevent weight gain, avoid stress, and handle social situations. This information is not intended to replace advice given to you by your health care provider. Make sure you discuss any questions you have with your health care provider. Document Revised: 11/13/2017 Document Reviewed: 11/28/2016 Elsevier Patient Education  2020 [employer].

## 2020-05-07 NOTE — Progress Notes (Signed)
Patient ID: Christine Haynes, female   DOB: February 09, 1996, 24 y.o.   MRN: [id number]  Reason for Consult: Endometriosis   Referred by Grayland Jack, *  Subjective:     HPI:  Christine Haynes is a 24 y.o. female.  She presents today for follow-up.  She did not tolerate her the antibiotics that she is prescribed for pain for PID.  She reports the pain was worse than ever on the third day after receiving the antibiotics.  She discontinued usage of the Flagyl.  She was then sent a prescription for Diflucan.  She reports that since taking the Diflucan she has been feeling better.  She reports that she is still having pain with intercourse.  She reports that the pain is present with both initial penetration and with deep presentation.  She reports that this pain with intercourse has been present since January.  She reports that the partner is unchanged and she has pain with both gentle and vigorous intercourse.  We reviewed options for trial of topical lidocaine pelvic floor physical therapy as well as laparoscopy to diagnose any possible endometriosis or pelvic adhesions.  Patient is most interested in proceeding with topical lidocaine at this time.  She declines physical floor therapy referral.  She is also curious if she could have infertility.  She reports that she has been trying to get pregnant for 2 years.  She reports that she is not currently using any form of contraception.  She recently had a menstrual cycle and tends to have monthly menstrual cycles.  She reports that her sexual partner has had 2 previous children.  She reports that she has a remote history of gonorrhea and trichomonas which occurred about 6 years ago.  She denies that her partner has not ever had any trauma hernia surgery or mumps.  She does not believe he has ever had a vasectomy or another sterilization procedure.  She is interested in lab work for infertility.  She declines an HSG or laparoscopy at this time to evaluate for  tubal patency.  She currently smokes 20 cigarettes a day.  She has done so for 6 to 7 years.  She has tried to quit in the past has not been successful.  She is not currently taking a prenatal vitamin.  She does use caffeine occasionally with soda.  She does not drink coffee.  She reports that she has recently cut back on her caffeine usage.   History reviewed. No pertinent past medical history. Family History  Problem Relation Age of Onset  . Fibroids Mother   . Irritable bowel syndrome Father   . Diabetes Maternal Grandfather   . Hypertension Maternal Grandfather    Past Surgical History:  Procedure Laterality Date  . MOUTH SURGERY      Short Social History:  Social History   Tobacco Use  . Smoking status: Current Every Day Smoker    Packs/day: 1.00    Years: 5.00    Pack years: 5.00    Types: Cigarettes  . Smokeless tobacco: Never Used  Substance Use Topics  . Alcohol use: No    Allergies  Allergen Reactions  . Penicillins     Current Outpatient Medications  Medication Sig Dispense Refill  . lidocaine (XYLOCAINE) 5 % ointment Apply 1 application topically daily as needed. 35.44 g 1   No current facility-administered medications for this visit.    Review of Systems  Constitutional: Negative for chills, fatigue, fever and unexpected weight change.  HENT:  Negative for trouble swallowing.  Eyes: Negative for loss of vision.  Respiratory: Negative for cough, shortness of breath and wheezing.  Cardiovascular: Negative for chest pain, leg swelling, palpitations and syncope.  GI: Negative for abdominal pain, blood in stool, diarrhea, nausea and vomiting.  GU: Negative for difficulty urinating, dysuria, frequency and hematuria.  Musculoskeletal: Negative for back pain, leg pain and joint pain.  Skin: Negative for rash.  Neurological: Negative for dizziness, headaches, light-headedness, numbness and seizures.  Psychiatric: Negative for behavioral problem, confusion,  depressed mood and sleep disturbance.        Objective:  Objective   Vitals:   05/07/20 1508  BP: 120/68  Weight: 138 lb (62.6 kg)  Height: 5\' 5"  (1.651 m)   Body mass index is 22.96 kg/m.  Physical Exam Vitals and nursing note reviewed.  Constitutional:      Appearance: She is well-developed.  HENT:     Head: Normocephalic and atraumatic.  Eyes:     Pupils: Pupils are equal, round, and reactive to light.  Cardiovascular:     Rate and Rhythm: Normal rate and regular rhythm.  Pulmonary:     Effort: Pulmonary effort is normal. No respiratory distress.  Skin:    General: Skin is warm and dry.  Neurological:     Mental Status: She is alert and oriented to person, place, and time.  Psychiatric:        Behavior: Behavior normal.        Thought Content: Thought content normal.        Judgment: Judgment normal.        Assessment/Plan:     24 year old following up for pelvic pain and infertility Dyspareunia-we will trial topical lidocaine and will follow up with patient to assess for success. Infertility we will obtain additional blood work and evaluation for today.  We will have patient follow-up on cycle day 21 days for progesterone testing for ovulation.  Patient will need to consider semen analysis and tubal evaluation in the future.  Normal pelvic ultrasound today. Patient was provided with information and resources for smoking cessation.  Recommended reduction in cigarette usage as well as eventual quitting of cigarette usage to help with infertility.  Recommended reduction of caffeine intake to 200 mg a day. Recommended initiation of prenatal vitamin.  More than 45 minutes were spent face to face with the patient in the room, reviewing the medical record, labs and images, and coordinating care for the patient. The plan of management was discussed in detail and counseling was provided.     Adrian Prows MD [facility], [place]  [facility] 05/07/2020 4:00 PM

## 2020-05-11 LAB — PROLACTIN: Prolactin: 5.6 ng/mL (ref 4.8–23.3)

## 2020-05-11 LAB — ANTI MULLERIAN HORMONE: ANTI-MULLERIAN HORMONE (AMH): 3.23 ng/mL

## 2020-05-11 LAB — FOLLICLE STIMULATING HORMONE: FSH: 8.4 m[IU]/mL

## 2020-05-11 LAB — TSH+FREE T4
Free T4: 0.97 ng/dL (ref 0.82–1.77)
TSH: 1.06 u[IU]/mL (ref 0.450–4.500)

## 2023-07-24 ENCOUNTER — Ambulatory Visit
Admission: EM | Admit: 2023-07-24 | Discharge: 2023-07-24 | Disposition: A | Discharge status: Home / Self Care | Payer: BC Managed Care – PPO | Source: Home / Self Care

## 2023-07-24 ENCOUNTER — Encounter: Payer: Self-pay | Admitting: Emergency Medicine

## 2023-07-24 DIAGNOSIS — J069 Acute upper respiratory infection, unspecified: Secondary | ICD-10-CM | POA: Insufficient documentation

## 2023-07-24 LAB — GROUP A STREP BY PCR: Group A Strep by PCR: NOT DETECTED

## 2023-07-24 MED ORDER — BENZONATATE 100 MG PO CAPS: 200.0000 mg | ORAL_CAPSULE | Freq: Three times a day (TID) | ORAL | 0 refills | Status: AC

## 2023-07-24 MED ORDER — PROMETHAZINE-DM 6.25-15 MG/5ML PO SYRP: 5.0000 mL | ORAL_SOLUTION | Freq: Four times a day (QID) | ORAL | 0 refills | Status: AC | PRN

## 2023-07-24 MED ORDER — IPRATROPIUM BROMIDE 0.06 % NA SOLN: 2.0000 | Freq: Four times a day (QID) | NASAL | 12 refills | Status: AC

## 2023-07-24 NOTE — Discharge Instructions (Signed)
Your strep test today was negative.  As we discussed, your symptoms are most consistent with a viral upper respiratory infection.  You may very well have had COVID though you have had symptoms for 6 days and you are outside the infectious window.  New CDC guidelines recommend that she wear a mask around other people for the first 5 days of your symptoms.  After that are no longer considered infectious.  Use over-the-counter Tylenol and/or ibuprofen according to the package instructions as needed.  Use the Atrovent nasal spray, 2 squirts in each nostril every 6 hours, as needed for runny nose and postnasal drip.  Use the Tessalon Perles every 8 hours during the day.  Take them with a small sip of water.  They may give you some numbness to the base of your tongue or a metallic taste in your mouth, this is normal.  Use the Promethazine DM cough syrup at bedtime for cough and congestion.  It will make you drowsy so do not take it during the day.  Return for reevaluation or see your primary care provider for any new or worsening symptoms.

## 2023-07-24 NOTE — ED Triage Notes (Signed)
Patient c/o sore throat and headache off and on for 6 days.  Patient reports sinus congestion and pressure that started yesterday.  Patient unsure of fevers. Patient needs a work note for today.

## 2023-07-24 NOTE — ED Provider Notes (Signed)
[facility]    CSN: [id number] Arrival date & time: 07/24/23  0810      History   Chief Complaint Chief Complaint  Patient presents with   Sore Throat    HPI Christine Haynes is a 27 y.o. female.   HPI  27 year old female with no significant past medical history who presents for evaluation of 6 days worth of upper respiratory symptoms to include headache and sore throat.  In the last day she has developed some nasal congestion with yellow nasal discharge.  She also reports that she has been expectorating some yellow sputum.  No documented fevers or complaints of shortness of breath or wheezing.  No known sick contacts or recent travel.  History reviewed. No pertinent past medical history.  There are no problems to display for this patient.   Past Surgical History:  Procedure Laterality Date   MOUTH SURGERY      OB History     Gravida  0   Para  0   Term  0   Preterm  0   AB  0   Living  0      SAB  0   IAB  0   Ectopic  0   Multiple  0   Live Births  0            Home Medications    Prior to Admission medications   Medication Sig Start Date End Date Taking? Authorizing Provider  benzonatate (TESSALON) 100 MG capsule Take 2 capsules (200 mg total) by mouth every 8 (eight) hours. 07/24/23  Yes Becky Augusta, NP  ipratropium (ATROVENT) 0.06 % nasal spray Place 2 sprays into both nostrils 4 (four) times daily. 07/24/23  Yes Becky Augusta, NP  promethazine-dextromethorphan (PROMETHAZINE-DM) 6.25-15 MG/5ML syrup Take 5 mLs by mouth 4 (four) times daily as needed. 07/24/23  Yes Becky Augusta, NP    Family History Family History  Problem Relation Age of Onset   Fibroids Mother    Irritable bowel syndrome Father    Diabetes Maternal Grandfather    Hypertension Maternal Grandfather     Social History Social History   Tobacco Use   Smoking status: Every Day    Current packs/day: 1.00    Average packs/day: 1 pack/day for 5.0 years (5.0 ttl  pk-yrs)    Types: Cigarettes   Smokeless tobacco: Never  Vaping Use   Vaping status: Never Used  Substance Use Topics   Alcohol use: No   Drug use: Not Currently     Allergies   Penicillins   Review of Systems Review of Systems  Constitutional:  Negative for fever.  HENT:  Positive for congestion, rhinorrhea, sinus pressure and sore throat.   Respiratory:  Positive for cough. Negative for shortness of breath and wheezing.   Neurological:  Positive for headaches.     Physical Exam Triage Vital Signs ED Triage Vitals  Encounter Vitals Group     BP 07/24/23 0824 115/81     Systolic BP Percentile --      Diastolic BP Percentile --      Pulse Rate 07/24/23 0824 76     Resp 07/24/23 0824 15     Temp 07/24/23 0824 98.7 F (37.1 C)     Temp Source 07/24/23 0824 Oral     SpO2 07/24/23 0824 98 %     Weight 07/24/23 0823 150 lb (68 kg)     Height 07/24/23 0823 5\' 5"  (1.651 m)  Head Circumference --      Peak Flow --      Pain Score 07/24/23 0823 5     Pain Loc --      Pain Education --      Exclude from Growth Chart --    No data found.  Updated Vital Signs BP 115/81 (BP Location: Right Arm)   Pulse 76   Temp 98.7 F (37.1 C) (Oral)   Resp 15   Ht 5\' 5"  (1.651 m)   Wt 150 lb (68 kg)   LMP 07/06/2023 (Approximate)   SpO2 98%   BMI 24.96 kg/m   Visual Acuity Right Eye Distance:   Left Eye Distance:   Bilateral Distance:    Right Eye Near:   Left Eye Near:    Bilateral Near:     Physical Exam Vitals and nursing note reviewed.  Constitutional:      Appearance: Normal appearance. She is not ill-appearing.  HENT:     Head: Normocephalic and atraumatic.     Right Ear: Tympanic membrane, ear canal and external ear normal. There is no impacted cerumen.     Left Ear: Tympanic membrane, ear canal and external ear normal. There is no impacted cerumen.     Nose: Congestion and rhinorrhea present.     Comments: These mucosa is pink and markedly edematous with  clear discharge in both nares.    Mouth/Throat:     Mouth: Mucous membranes are moist.     Pharynx: Oropharynx is clear. Posterior oropharyngeal erythema present. No oropharyngeal exudate.     Comments: Bilateral tonsillar pillars are 1+ edematous and erythematous.  The soft palate is also erythematous.  No appreciable exudate. Neck:     Comments: Bilateral nontender anterior cervical lymphadenopathy. Cardiovascular:     Rate and Rhythm: Normal rate and regular rhythm.     Pulses: Normal pulses.     Heart sounds: Normal heart sounds. No murmur heard.    No friction rub. No gallop.  Pulmonary:     Effort: Pulmonary effort is normal.     Breath sounds: Normal breath sounds. No wheezing, rhonchi or rales.  Musculoskeletal:     Cervical back: Normal range of motion and neck supple. No tenderness.  Lymphadenopathy:     Cervical: Cervical adenopathy present.  Skin:    General: Skin is warm and dry.     Capillary Refill: Capillary refill takes less than 2 seconds.     Findings: No erythema or rash.  Neurological:     General: No focal deficit present.     Mental Status: She is alert and oriented to person, place, and time.      UC Treatments / Results  Labs (all labs ordered are listed, but only abnormal results are displayed) Labs Reviewed  GROUP A STREP BY PCR    EKG   Radiology No results found.  Procedures Procedures (including critical care time)  Medications Ordered in UC Medications - No data to display  Initial Impression / Assessment and Plan / UC Course  I have reviewed the triage vital signs and the nursing notes.  Pertinent labs & imaging results that were available during my care of the patient were reviewed by me and considered in my medical decision making (see chart for details).   Patient is a very pleasant, nontoxic-appearing 27 year old female presenting for evaluation of respiratory symptoms as outlined HPI above.  Symptoms began with a headache and  sore throat so I will order a strep  PCR to evaluate for the presence of strep.  She is recently in the last 24 hours developed nasal congestion with yellow nasal discharge and also intermittent production of yellow sputum which I believe is secondary to postnasal drip.  The differential diagnosis include viral URI or COVID.  Given that her symptoms started 6 days ago she is outside the infectious window for COVID 19 so I will not order a COVID PCR at this time.  If patient strep is negative I will treat her for viral URI with symptom space management to include Atrovent nasal spray, Tessalon Perles, and promethazine DM cough syrup.  Strep PCR is negative.  I will discharge patient home with a diagnosis of viral URI with cough and treated with adjuvant Iskra, Tessalon Perles, and Promethazine DM cough syrup as stated above.  Work note provided.  Return precautions reviewed.   Final Clinical Impressions(s) / UC Diagnoses   Final diagnoses:  Viral URI with cough     Discharge Instructions      Your strep test today was negative.  As we discussed, your symptoms are most consistent with a viral upper respiratory infection.  You may very well have had COVID though you have had symptoms for 6 days and you are outside the infectious window.  New CDC guidelines recommend that she wear a mask around other people for the first 5 days of your symptoms.  After that are no longer considered infectious.  Use over-the-counter Tylenol and/or ibuprofen according to the package instructions as needed.  Use the Atrovent nasal spray, 2 squirts in each nostril every 6 hours, as needed for runny nose and postnasal drip.  Use the Tessalon Perles every 8 hours during the day.  Take them with a small sip of water.  They may give you some numbness to the base of your tongue or a metallic taste in your mouth, this is normal.  Use the Promethazine DM cough syrup at bedtime for cough and congestion.  It will make you  drowsy so do not take it during the day.  Return for reevaluation or see your primary care provider for any new or worsening symptoms.      ED Prescriptions     Medication Sig Dispense Auth. Provider   benzonatate (TESSALON) 100 MG capsule Take 2 capsules (200 mg total) by mouth every 8 (eight) hours. 21 capsule Becky Augusta, NP   ipratropium (ATROVENT) 0.06 % nasal spray Place 2 sprays into both nostrils 4 (four) times daily. 15 mL Becky Augusta, NP   promethazine-dextromethorphan (PROMETHAZINE-DM) 6.25-15 MG/5ML syrup Take 5 mLs by mouth 4 (four) times daily as needed. 118 mL Becky Augusta, NP      PDMP not reviewed this encounter.   Becky Augusta, NP 07/24/23 [id number]
